# Patient Record
Sex: Male | Born: 1985 | Race: Black or African American | Hispanic: Yes | Marital: Single | Smoking: Never smoker
Health system: Southern US, Community
[De-identification: ages and names within clinical notes are randomized; demographics above are authoritative.]

---

## 2009-12-26 ENCOUNTER — Emergency Department (HOSPITAL_COMMUNITY): Admission: EM | Admit: 2009-12-26 | Discharge: 2009-12-27 | Payer: Self-pay | Admitting: Emergency Medicine

## 2010-11-23 LAB — URINALYSIS, ROUTINE W REFLEX MICROSCOPIC
Glucose, UA: NEGATIVE mg/dL
Ketones, ur: NEGATIVE mg/dL
Leukocytes, UA: NEGATIVE
Nitrite: NEGATIVE
Specific Gravity, Urine: 1.015 (ref 1.005–1.030)
pH: 6 (ref 5.0–8.0)

## 2010-11-23 LAB — URINE MICROSCOPIC-ADD ON

## 2010-11-23 LAB — URINE CULTURE

## 2011-08-30 IMAGING — CT CT ABD-PELV W/O CM
2 of 3 series · 9 of 46 positions shown, 11 images · non-contrast
Comparison: None

CLINICAL DATA: Right flank pain and hematuria; difficulty
urinating.

CT ABDOMEN AND PELVIS WITHOUT CONTRAST
TECHNIQUE: Multidetector CT imaging of the abdomen and pelvis was
performed following the standard protocol without intravenous
contrast.

[Series 4: mpr coronal (id) · coronal · 0.74mm/px · 8 of 99 slices shown, 9 images]
[im 11/99  soft-tissue]
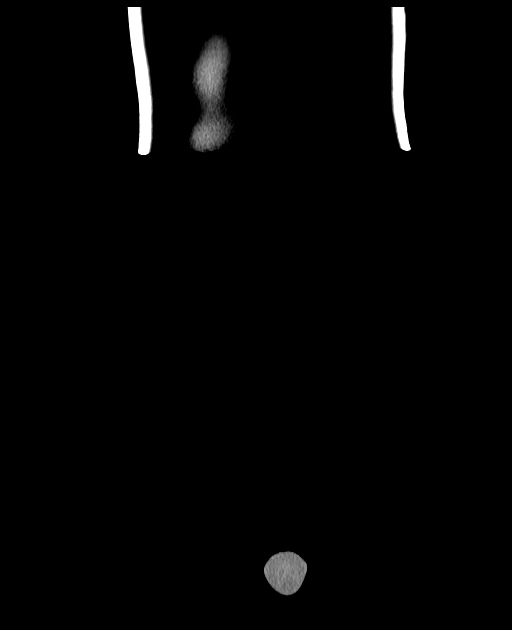
[im 11/99  bone]
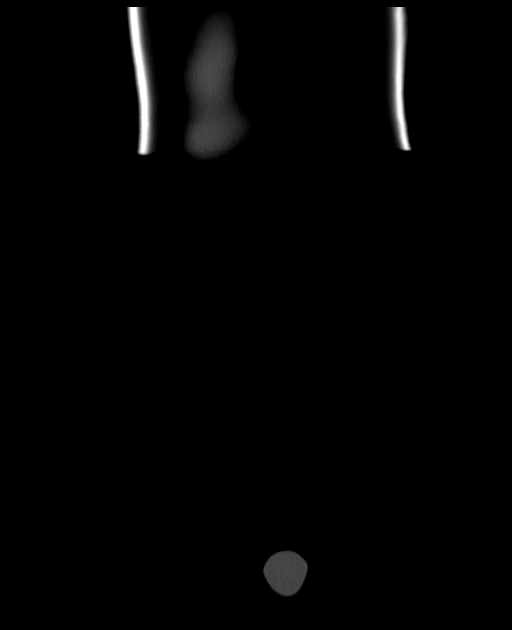
[im 22/99  soft-tissue]
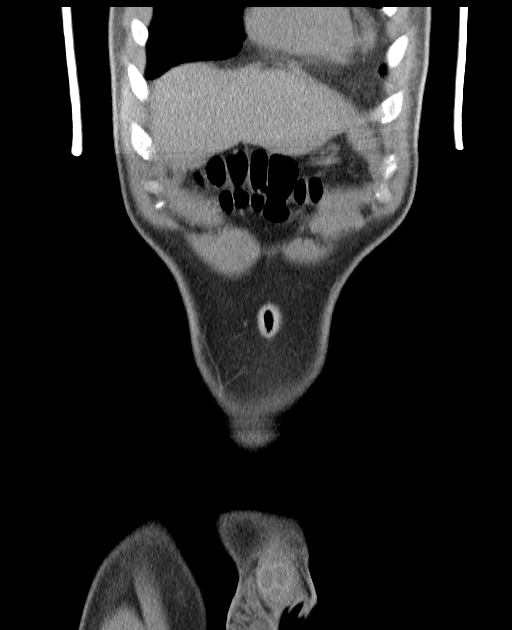
[im 33/99  soft-tissue]
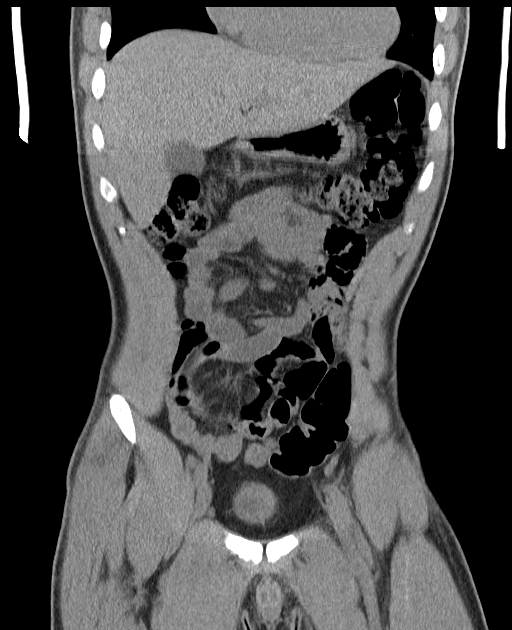
[im 44/99  soft-tissue]
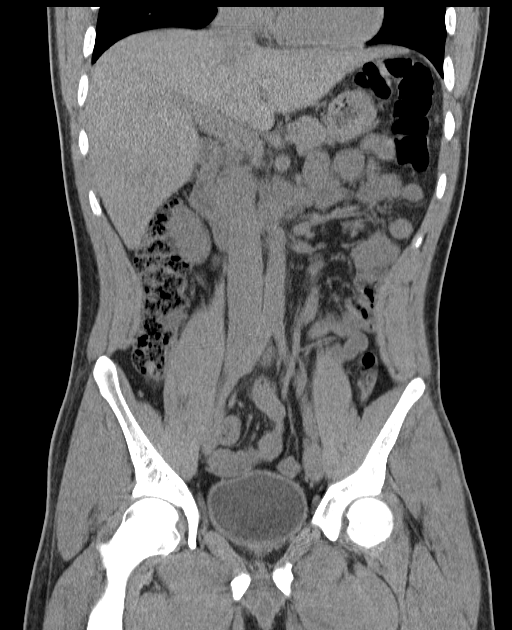
[im 55/99  soft-tissue]
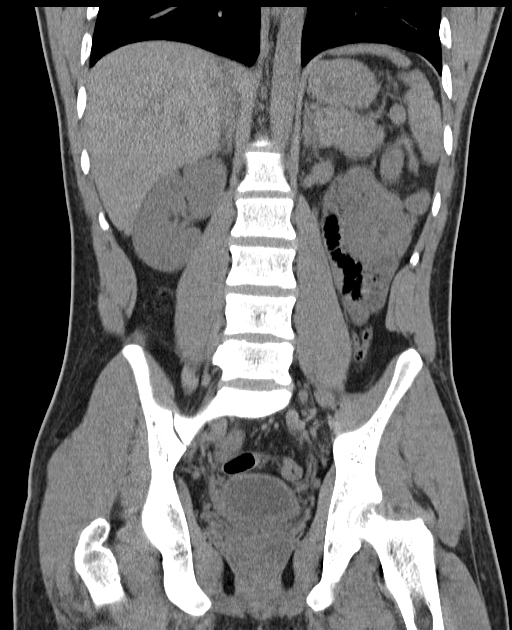
[im 66/99  soft-tissue]
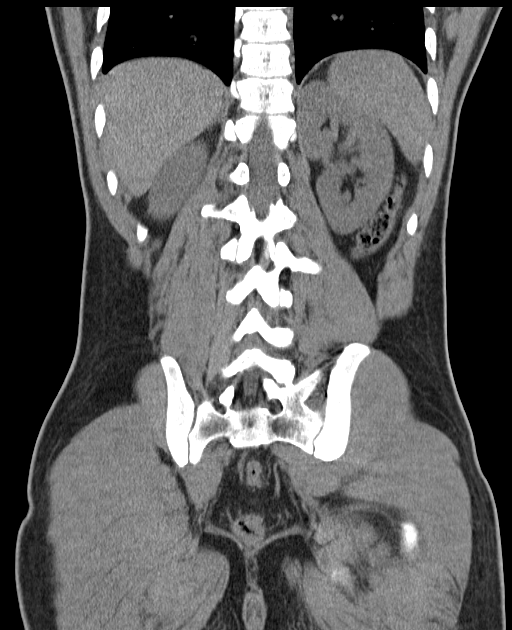
[im 77/99  soft-tissue]
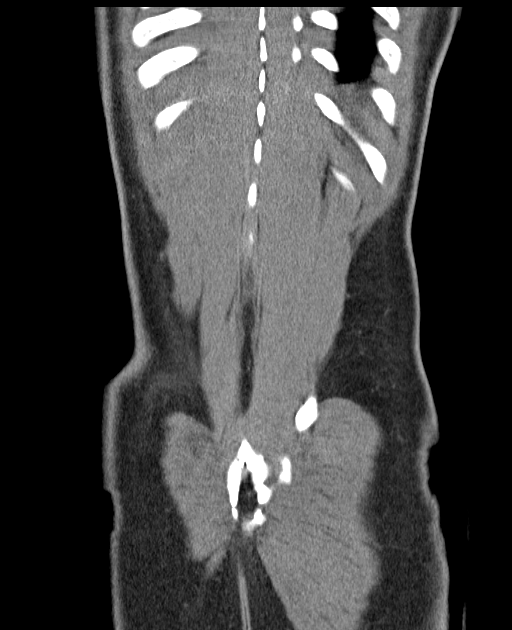
[im 88/99  soft-tissue]
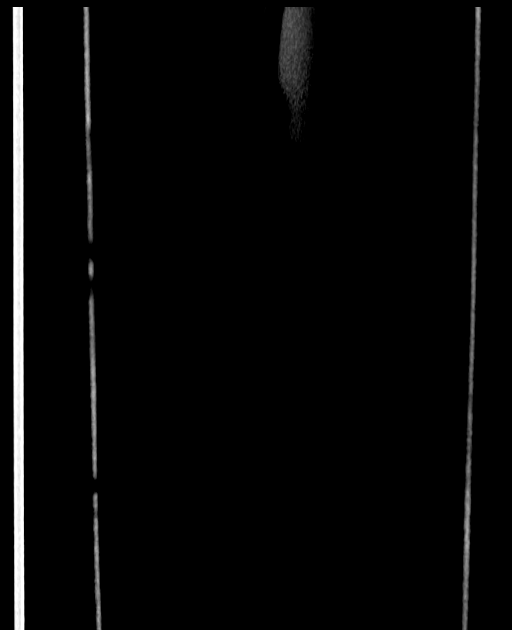

[Series 5: mpr sagittal (id) · sagittal · 0.56mm/px · 1 of 141 slices shown, 2 images]
[im 47/141  soft-tissue]
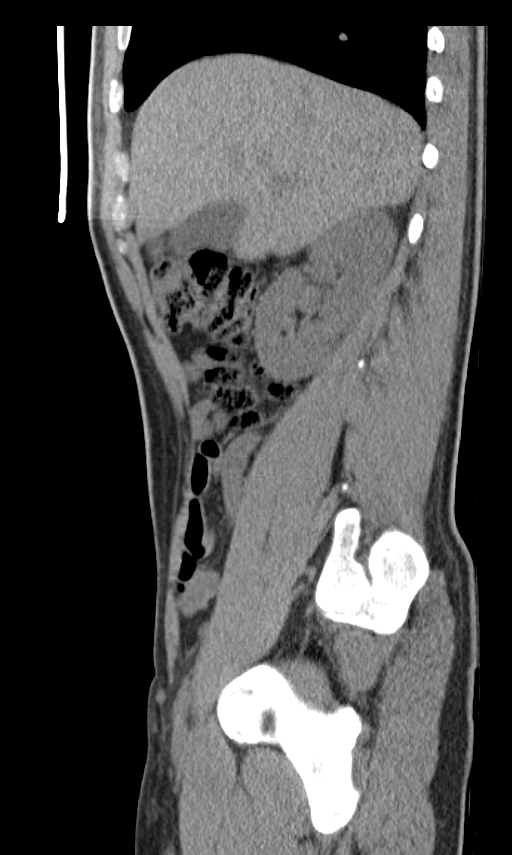
[im 47/141  bone]
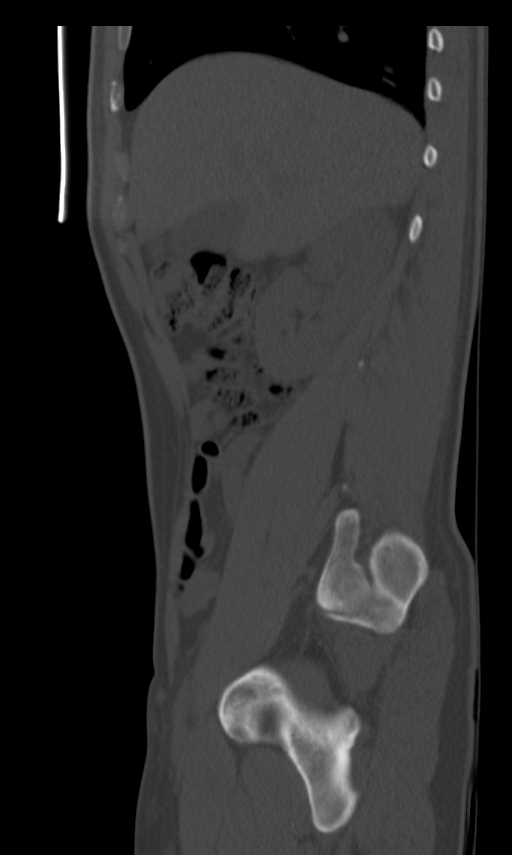

[9 of 46 positions shown; findings below may reference images not displayed]

FINDINGS: The visualized lung bases are clear.

The liver and spleen are unremarkable in appearance.  The
gallbladder is within normal limits.  The pancreas and adrenal
glands are unremarkable.

The kidneys are unremarkable in appearance.  There is no evidence
of hydronephrosis.  No ureteral dilatation is appreciated.  No
renal or ureteral stones are identified.  No perinephric stranding
is seen.

No free fluid is identified.  The small bowel is unremarkable in
appearance.  The stomach is within normal limits.  No acute
vascular abnormalities are seen.

The colon is unremarkable in appearance.  The appendix is normal in
caliber and contains air, without evidence for acute appendicitis.

The bladder is mildly smoothly thick-walled; this could reflect
mild acute cystitis.  No focal bladder mass is identified.  No
significant surrounding stranding is appreciated.  The prostate
remains normal in size.  No inguinal lymphadenopathy is identified.

No acute osseous abnormalities are identified.
IMPRESSION: 1.  Mildly smoothly thick-walled bladder, without a focal bladder
mass.  This could reflect mild acute cystitis.
2.  No evidence of hydronephrosis; kidneys unremarkable in
appearance.

## 2011-12-21 ENCOUNTER — Encounter (HOSPITAL_COMMUNITY): Payer: Self-pay | Admitting: *Deleted

## 2011-12-21 ENCOUNTER — Emergency Department (HOSPITAL_COMMUNITY)
Admission: EM | Admit: 2011-12-21 | Discharge: 2011-12-22 | Disposition: A | Discharge status: Home / Self Care | Payer: BC Managed Care – PPO | Attending: Emergency Medicine | Admitting: Emergency Medicine

## 2011-12-21 DIAGNOSIS — S91009A Unspecified open wound, unspecified ankle, initial encounter: Secondary | ICD-10-CM | POA: Insufficient documentation

## 2011-12-21 DIAGNOSIS — S81009A Unspecified open wound, unspecified knee, initial encounter: Secondary | ICD-10-CM | POA: Insufficient documentation

## 2011-12-21 DIAGNOSIS — W260XXA Contact with knife, initial encounter: Secondary | ICD-10-CM | POA: Insufficient documentation

## 2011-12-21 DIAGNOSIS — M79609 Pain in unspecified limb: Secondary | ICD-10-CM | POA: Insufficient documentation

## 2011-12-21 DIAGNOSIS — S81812A Laceration without foreign body, left lower leg, initial encounter: Secondary | ICD-10-CM

## 2011-12-21 NOTE — ED Notes (Signed)
Pt reports he was cooking and accidentally cut his left thigh with a knife, approx 1 inch cut noted, bleeding controlled

## 2011-12-21 NOTE — ED Notes (Signed)
Pt states that he was cooking and dropped knife and accidentally cut self to left lower leg

## 2011-12-22 MED ORDER — ONDANSETRON HCL 4 MG PO TABS
4.0000 mg | ORAL_TABLET | Freq: Once | ORAL | Status: AC
  Administered 2011-12-22: 4 mg via ORAL
  Filled 2011-12-22: qty 1

## 2011-12-22 MED ORDER — DIPHTH-ACELL PERTUSSIS-TETANUS 25-58-10 LF-MCG/0.5 IM SUSP
0.5000 mL | Freq: Once | INTRAMUSCULAR | Status: AC
  Administered 2011-12-22: 0.5 mL via INTRAMUSCULAR
  Filled 2011-12-22: qty 0.5

## 2011-12-22 MED ORDER — HYDROCODONE-ACETAMINOPHEN 5-325 MG PO TABS
2.0000 | ORAL_TABLET | Freq: Once | ORAL | Status: AC
  Administered 2011-12-22: 2 via ORAL
  Filled 2011-12-22: qty 2

## 2011-12-22 MED ORDER — HYDROCODONE-ACETAMINOPHEN 5-325 MG PO TABS
ORAL_TABLET | ORAL | Status: DC
Start: 2011-12-22 — End: 2016-04-15

## 2011-12-22 NOTE — ED Provider Notes (Signed)
History     CSN: [id number]  Arrival date & time 12/21/11  2137   First MD Initiated Contact with Patient 12/22/11 0004      Chief Complaint  Patient presents with  . Laceration    (Consider location/radiation/quality/duration/timing/severity/associated sxs/prior treatment) Patient is a 26 y.o. male presenting with skin laceration. The history is provided by the patient.  Laceration  The incident occurred 3 to 5 hours ago. The laceration is located on the left leg. The laceration is 2 cm in size. The laceration mechanism was a a clean knife. The pain is moderate. The pain has been constant since onset. He reports no foreign bodies present. His tetanus status is out of date.    History reviewed. No pertinent past medical history.  History reviewed. No pertinent past surgical history.  No family history on file.  History  Substance Use Topics  . Smoking status: Never Smoker   . Smokeless tobacco: Not on file  . Alcohol Use: No      Review of Systems  Constitutional: Negative for activity change.       All ROS Neg except as noted in HPI  HENT: Negative for nosebleeds and neck pain.   Eyes: Negative for photophobia and discharge.  Respiratory: Negative for cough, shortness of breath and wheezing.   Cardiovascular: Negative for chest pain and palpitations.  Gastrointestinal: Negative for abdominal pain and blood in stool.  Genitourinary: Negative for dysuria, frequency and hematuria.  Musculoskeletal: Negative for back pain and arthralgias.  Skin: Negative.   Neurological: Negative for dizziness, seizures and speech difficulty.  Psychiatric/Behavioral: Negative for hallucinations and confusion.    Allergies  Review of patient's allergies indicates no known allergies.  Home Medications   Current Outpatient Rx  Name Route Sig Dispense Refill  . HYDROCODONE-ACETAMINOPHEN 5-325 MG PO TABS  1 or 2 po q4h prn pain 15 tablet 0    BP 144/89  Pulse 64  Temp(Src) 97.4  F (36.3 C) (Oral)  Resp 20  Wt 163 lb 3.2 oz (74.027 kg)  SpO2 100%  Physical Exam  Nursing note and vitals reviewed. Constitutional: He is oriented to person, place, and time. He appears well-developed and well-nourished.  Non-toxic appearance.  HENT:  Head: Normocephalic.  Right Ear: Tympanic membrane and external ear normal.  Left Ear: Tympanic membrane and external ear normal.  Eyes: EOM and lids are normal. Pupils are equal, round, and reactive to light.  Neck: Normal range of motion. Neck supple. Carotid bruit is not present.  Cardiovascular: Normal rate, regular rhythm, normal heart sounds, intact distal pulses and normal pulses.   Pulmonary/Chest: Breath sounds normal. No respiratory distress.  Abdominal: Soft. Bowel sounds are normal. There is no tenderness. There is no guarding.  Musculoskeletal: Normal range of motion.       Laceration to the left lateral calf. Bleeding control. No bone or tendon involvement. Distal pulses, sensory wnl. Good cap refill.  Lymphadenopathy:       Head (right side): No submandibular adenopathy present.       Head (left side): No submandibular adenopathy present.    He has no cervical adenopathy.  Neurological: He is alert and oriented to person, place, and time. He has normal strength. No cranial nerve deficit or sensory deficit.  Skin: Skin is warm and dry.  Psychiatric: He has a normal mood and affect. His speech is normal.    ED Course  Procedures : LACERATION REPAIR LEFT LOWER LEG. -  The Left lateral calf  was painted with betadine and infiltrated with 0.5% Sensorcaine. The wound was cleansed wth a surgical brush and irrigated. Wound repaired with 8 staples. Sterile dressing applied by me. Pt tolerated procedure without problem. Tetanus updated.  Labs Reviewed - No data to display No results found.   1. Laceration of lower leg, left       MDM  I have reviewed nursing notes, vital signs, and all appropriate lab and imaging results  for this patient. Pt sustained a laceration of the left calf. Wound repaired without problem. Pt to have staples removed in 10 days. Rx for  Norco given to the patient.       Kathie Dike, [place] 12/22/11 [phone]

## 2011-12-22 NOTE — Discharge Instructions (Signed)
Please keep wound clean and dry. Change dressing daily starting Friday night. Please come to the Emergency Room for staples to be removed in 10 days. (27th) Use Norco for pain if needed. This may cause drowsiness, use with caution.

## 2011-12-22 NOTE — ED Provider Notes (Signed)
Medical screening examination/treatment/procedure(s) were performed by non-physician practitioner and as supervising physician I was immediately available for consultation/collaboration.   Vida Roller, MD 12/22/11 [phone]

## 2012-01-08 ENCOUNTER — Emergency Department (HOSPITAL_COMMUNITY)
Admission: EM | Admit: 2012-01-08 | Discharge: 2012-01-08 | Disposition: A | Discharge status: Home / Self Care | Payer: BC Managed Care – PPO | Attending: Emergency Medicine | Admitting: Emergency Medicine

## 2012-01-08 ENCOUNTER — Encounter (HOSPITAL_COMMUNITY): Payer: Self-pay

## 2012-01-08 DIAGNOSIS — Z4802 Encounter for removal of sutures: Secondary | ICD-10-CM

## 2012-01-08 NOTE — ED Notes (Signed)
Pt had laceration to left lower leg that required staples two weeks ago, pt denies any problems

## 2012-01-08 NOTE — ED Notes (Signed)
7 staples removed from left lower leg, pt tolerated well

## 2012-01-08 NOTE — ED Notes (Signed)
Here today for staple removal from left lower leg

## 2012-01-08 NOTE — ED Provider Notes (Signed)
History     CSN: [id number]  Arrival date & time 01/08/12  1257   First MD Initiated Contact with Patient 01/08/12 1319      Chief Complaint  Patient presents with  . Suture / Staple Removal    (Consider location/radiation/quality/duration/timing/severity/associated sxs/prior treatment) HPI Comments: States he has been working every day and could get back to the ED for staple removal.  Patient is a 26 y.o. male presenting with suture removal. The history is provided by the patient. No language interpreter was used.  Suture / Staple Removal  Treated in ED: ~ 20 days ago. Treatments since wound repair include regular soap and water washings. His temperature was unmeasured prior to arrival. There has been no drainage from the wound. There is no redness present. There is no swelling present. The pain has no pain.    History reviewed. No pertinent past medical history.  History reviewed. No pertinent past surgical history.  No family history on file.  History  Substance Use Topics  . Smoking status: Never Smoker   . Smokeless tobacco: Not on file  . Alcohol Use: No      Review of Systems  Constitutional: Negative for fever.  Skin: Positive for wound.  All other systems reviewed and are negative.    Allergies  Review of patient's allergies indicates no known allergies.  Home Medications   Current Outpatient Rx  Name Route Sig Dispense Refill  . HYDROCODONE-ACETAMINOPHEN 5-325 MG PO TABS  1 or 2 po q4h prn pain 15 tablet 0    BP 104/61  Pulse 60  Temp(Src) 97.4 F (36.3 C) (Oral)  Resp 20  Ht 5\' 8"  (1.727 m)  Wt 180 lb (81.647 kg)  BMI 27.37 kg/m2  SpO2 100%  Physical Exam  Nursing note and vitals reviewed. Constitutional: He is oriented to person, place, and time. He appears well-developed and well-nourished.  HENT:  Head: Normocephalic and atraumatic.  Eyes: EOM are normal.  Neck: Normal range of motion.  Cardiovascular: Normal rate, regular rhythm,  normal heart sounds and intact distal pulses.   Pulmonary/Chest: Effort normal and breath sounds normal. No respiratory distress.  Abdominal: Soft. He exhibits no distension. There is no tenderness.  Musculoskeletal: Normal range of motion. He exhibits no tenderness.       Legs:      Wound is well healed.  No signs of infection.  Neurological: He is alert and oriented to person, place, and time.  Skin: Skin is warm and dry.  Psychiatric: He has a normal mood and affect. Judgment normal.    ED Course  Procedures (including critical care time)  Labs Reviewed - No data to display No results found.   1. Encounter for staple removal       MDM  Return prn        Worthy Rancher, [place] 01/08/12 1347

## 2012-01-09 NOTE — ED Provider Notes (Signed)
Medical screening examination/treatment/procedure(s) were performed by non-physician practitioner and as supervising physician I was immediately available for consultation/collaboration.  Daliana Leverett, MD 01/09/12 2158 

## 2016-04-15 ENCOUNTER — Encounter (HOSPITAL_COMMUNITY): Payer: Self-pay | Admitting: Family Medicine

## 2016-04-15 ENCOUNTER — Ambulatory Visit (HOSPITAL_COMMUNITY)
Admission: EM | Admit: 2016-04-15 | Discharge: 2016-04-15 | Disposition: A | Discharge status: Home / Self Care | Payer: Self-pay | Attending: Family Medicine | Admitting: Family Medicine

## 2016-04-15 DIAGNOSIS — N4889 Other specified disorders of penis: Secondary | ICD-10-CM

## 2016-04-15 NOTE — ED Triage Notes (Signed)
Pt reports rash on penis onset yest... Denies pain, fevers, penile d/c.  States he is sexually active in a monogamous relationship... A&O x4... NAD

## 2016-04-15 NOTE — ED Provider Notes (Signed)
[facility]    CSN: [id number] Arrival date & time: 04/15/16  1536  First Provider Contact:  First MD Initiated Contact with Patient 04/15/16 1610   History   Chief Complaint Chief Complaint  Patient presents with  . Exposure to STD    HPI Phillip Greer is a 30 y.o. male accompanied by his male partner presenting for penile rash he noticed yesterday. The rash is small white spots just under the foreskin and is not irritating, itchy, painful, draining, or spreading. He has had no STI's in the past. No fever. Partner exhibits no symptoms.   HPI  History reviewed. No pertinent past medical history.  There are no active problems to display for this patient.   History reviewed. No pertinent surgical history.     Home Medications    Prior to Admission medications   Not on File    Family History History reviewed. No pertinent family history.  Social History Social History  Substance Use Topics  . Smoking status: Never Smoker  . Smokeless tobacco: Never Used  . Alcohol use No     Allergies   Review of patient's allergies indicates no known allergies.   Review of Systems Review of Systems As above  Physical Exam Triage Vital Signs ED Triage Vitals  Enc Vitals Group     BP 04/15/16 1618 117/77     Pulse Rate 04/15/16 1618 60     Resp 04/15/16 1618 20     Temp 04/15/16 1618 97.9 F (36.6 C)     Temp Source 04/15/16 1618 Oral     SpO2 04/15/16 1618 99 %     Weight --      Height --      Head Circumference --      Peak Flow --      Pain Score 04/15/16 1619 1     Pain Loc --      Pain Edu? --      Excl. in GC? --    No data found.   Updated Vital Signs BP 117/77 (BP Location: Left Arm)   Pulse 60   Temp 97.9 F (36.6 C) (Oral)   Resp 20   SpO2 99%   Visual Acuity Right Eye Distance:   Left Eye Distance:   Bilateral Distance:    Right Eye Near:   Left Eye Near:    Bilateral Near:     Physical Exam  Constitutional: He  appears well-developed and well-nourished.  Genitourinary:  Genitourinary Comments: Uncircumcised penis with several pinpoint pearly papules on and proximal to the dorsal corona. No vesicles or other lesions. Scrotum unaffected.   Vitals reviewed. No discharge.   UC Treatments / Results  Labs (all labs ordered are listed, but only abnormal results are displayed) Labs Reviewed - No data to display  EKG  EKG Interpretation None       Radiology No results found.  Procedures Procedures (including critical care time)  Medications Ordered in UC Medications - No data to display   Initial Impression / Assessment and Plan / UC Course  I have reviewed the triage vital signs and the nursing notes.  Pertinent labs & imaging results that were available during my care of the patient were reviewed by me and considered in my medical decision making (see chart for details).  Final Clinical Impressions(s) / UC Diagnoses   Final diagnoses:  Pearly penile papules   30 y.o. male with pearly penile papules. Reviewed benign nature of this  finding and allayed fears of STI. Both he and his partner have recently had negative STI work ups as well. He will return if symptoms appear.   New Prescriptions Current Discharge Medication List       Rupert Azzara B Lylia Karn,Tyrone Nine MD 04/15/16 [phone]

## 2016-04-15 NOTE — Discharge Instructions (Signed)
The rash is considered a normal variant. It is NOT A DISEASE and is NOT SEXUALLY TRANSMITTED.

## 2017-06-24 ENCOUNTER — Encounter (HOSPITAL_COMMUNITY): Payer: Self-pay | Admitting: Emergency Medicine

## 2017-06-24 ENCOUNTER — Ambulatory Visit (HOSPITAL_COMMUNITY)
Admission: EM | Admit: 2017-06-24 | Discharge: 2017-06-24 | Disposition: A | Discharge status: Home / Self Care | Payer: Self-pay | Attending: Family Medicine | Admitting: Family Medicine

## 2017-06-24 DIAGNOSIS — N485 Ulcer of penis: Secondary | ICD-10-CM | POA: Insufficient documentation

## 2017-06-24 MED ORDER — MUPIROCIN CALCIUM 2 % EX CREA: 1.0000 "application " | TOPICAL_CREAM | Freq: Two times a day (BID) | CUTANEOUS | 0 refills | Status: AC

## 2017-06-24 NOTE — ED Triage Notes (Signed)
Pt reports mult blisters on shaft of penis ... Pt is not circumcised  Denies pain, fevers, chills  Sexually active in a monogamous relationship x3 years... Does not use condoms  A&O X4... NAD... Ambulatory

## 2017-06-24 NOTE — ED Notes (Signed)
Sent to the bathroom for a dirty urine 

## 2017-06-25 LAB — RPR: RPR: NONREACTIVE

## 2017-06-25 LAB — HIV ANTIBODY (ROUTINE TESTING W REFLEX): HIV SCREEN 4TH GENERATION: NONREACTIVE

## 2017-06-26 LAB — URINE CYTOLOGY ANCILLARY ONLY
Chlamydia: NEGATIVE
Neisseria Gonorrhea: NEGATIVE
Trichomonas: NEGATIVE

## 2017-06-26 NOTE — ED Provider Notes (Signed)
  [facility]   [id number] 06/24/17 Arrival Time: 1625  ASSESSMENT & PLAN:  1. Penile ulcer     Meds ordered this encounter  Medications  . mupirocin cream (BACTROBAN) 2 %    Sig: Apply 1 application topically 2 (two) times daily.    Dispense:  15 g    Refill:  0   Urine cytology, RPR, HIV sent at patient's request. The lesion on his penis does not look like HSV to me. Will monitor. Reviewed expectations re: course of current medical issues. Questions answered. Outlined signs and symptoms indicating need for more acute intervention. Patient verbalized understanding. After Visit Summary given.   SUBJECTIVE:  Phillip Greer is a 31 y.o. male who presents with complaint of "blister" on penis. Noticed a few days ago. Non-painful. No penile D/C. Worried about possible HSV. One male sexual partner for the past 3 years; with him today. No self treatment. No h/o STD or penile ulcer.  ROS: As per HPI.   OBJECTIVE:  Vitals:   06/24/17 1643  BP: 113/67  Pulse: 72  Resp: 16  Temp: 98.3 F (36.8 C)  TempSrc: Oral  SpO2: 100%    General appearance: alert; no distress Abdomen: soft, non-tender; bowel sounds normal; no masses or organomegaly; no guarding or rebound tenderness Back: no CVA tenderness GU: at head of penis there is a small bump/erosion; appear he has been scratching it with resulting small excoriation; no sign of infection; non-tender to palpation Extremities: no cyanosis or edema; symmetrical with no gross deformities Skin: warm and dry Psychological: alert and cooperative; normal mood and affect  Labs: Results for orders placed or performed during the hospital encounter of 06/24/17  RPR  Result Value Ref Range   RPR Ser Ql Non Reactive Non Reactive  HIV antibody  Result Value Ref Range   HIV Screen 4th Generation wRfx Non Reactive Non Reactive   Labs Reviewed  RPR  HIV ANTIBODY (ROUTINE TESTING)  URINE CYTOLOGY ANCILLARY ONLY     Imaging: No results found.  No Known Allergies   Social History   Social History  . Marital status: Single    Spouse name: N/A  . Number of children: N/A  . Years of education: N/A   Occupational History  . Not on file.   Social History Main Topics  . Smoking status: Never Smoker  . Smokeless tobacco: Never Used  . Alcohol use No  . Drug use: No  . Sexual activity: Yes   Other Topics Concern  . Not on file   Social History Narrative  . No narrative on file      Mardella LaymanHagler, Jasmen Emrich, MD 06/26/17 1004

## 2017-06-28 LAB — URINE CYTOLOGY ANCILLARY ONLY: Candida vaginitis: NEGATIVE

## 2019-07-31 ENCOUNTER — Emergency Department (HOSPITAL_COMMUNITY)
Admission: EM | Admit: 2019-07-31 | Discharge: 2019-08-01 | Disposition: A | Discharge status: Home / Self Care | Payer: Self-pay | Attending: Emergency Medicine | Admitting: Emergency Medicine

## 2019-07-31 ENCOUNTER — Other Ambulatory Visit: Payer: Self-pay

## 2019-07-31 ENCOUNTER — Encounter (HOSPITAL_COMMUNITY): Payer: Self-pay | Admitting: Emergency Medicine

## 2019-07-31 ENCOUNTER — Emergency Department (HOSPITAL_COMMUNITY): Payer: Self-pay

## 2019-07-31 DIAGNOSIS — S61209A Unspecified open wound of unspecified finger without damage to nail, initial encounter: Secondary | ICD-10-CM

## 2019-07-31 DIAGNOSIS — Y929 Unspecified place or not applicable: Secondary | ICD-10-CM | POA: Insufficient documentation

## 2019-07-31 DIAGNOSIS — W312XXA Contact with powered woodworking and forming machines, initial encounter: Secondary | ICD-10-CM | POA: Insufficient documentation

## 2019-07-31 DIAGNOSIS — S61200A Unspecified open wound of right index finger without damage to nail, initial encounter: Secondary | ICD-10-CM | POA: Insufficient documentation

## 2019-07-31 DIAGNOSIS — S62632B Displaced fracture of distal phalanx of right middle finger, initial encounter for open fracture: Secondary | ICD-10-CM | POA: Insufficient documentation

## 2019-07-31 DIAGNOSIS — S62639B Displaced fracture of distal phalanx of unspecified finger, initial encounter for open fracture: Secondary | ICD-10-CM

## 2019-07-31 DIAGNOSIS — Y999 Unspecified external cause status: Secondary | ICD-10-CM | POA: Insufficient documentation

## 2019-07-31 DIAGNOSIS — Y9389 Activity, other specified: Secondary | ICD-10-CM | POA: Insufficient documentation

## 2019-07-31 DIAGNOSIS — Z23 Encounter for immunization: Secondary | ICD-10-CM | POA: Insufficient documentation

## 2019-07-31 DIAGNOSIS — S6990XA Unspecified injury of unspecified wrist, hand and finger(s), initial encounter: Secondary | ICD-10-CM

## 2019-07-31 NOTE — ED Triage Notes (Signed)
Patient reports injury to right distal middle and index finger this afternoon from a wood router machine at work , dressings applied prior to arrival with minimal bleeding .

## 2019-08-01 MED ORDER — TETANUS-DIPHTH-ACELL PERTUSSIS 5-2.5-18.5 LF-MCG/0.5 IM SUSP
0.5000 mL | Freq: Once | INTRAMUSCULAR | Status: AC
  Administered 2019-08-01: 06:00:00 0.5 mL via INTRAMUSCULAR
  Filled 2019-08-01: qty 0.5

## 2019-08-01 MED ORDER — FENTANYL CITRATE (PF) 100 MCG/2ML IJ SOLN
50.0000 ug | Freq: Once | INTRAMUSCULAR | Status: AC
  Administered 2019-08-01: 06:00:00 50 ug via INTRAVENOUS
  Filled 2019-08-01: qty 2

## 2019-08-01 MED ORDER — HYDROCODONE-ACETAMINOPHEN 5-325 MG PO TABS: 1.0000 | ORAL_TABLET | ORAL | 0 refills | Status: AC | PRN

## 2019-08-01 MED ORDER — CEFAZOLIN SODIUM-DEXTROSE 1-4 GM/50ML-% IV SOLN
1.0000 g | Freq: Once | INTRAVENOUS | Status: AC
  Administered 2019-08-01: 06:00:00 1 g via INTRAVENOUS
  Filled 2019-08-01: qty 50

## 2019-08-01 MED ORDER — CEPHALEXIN 500 MG PO CAPS: 500.0000 mg | ORAL_CAPSULE | Freq: Four times a day (QID) | ORAL | 0 refills | Status: AC

## 2019-08-01 MED ORDER — SODIUM CHLORIDE 0.9 % IV BOLUS
1000.0000 mL | Freq: Once | INTRAVENOUS | Status: AC
  Administered 2019-08-01: 06:00:00 1000 mL via INTRAVENOUS

## 2019-08-01 MED ORDER — CEPHALEXIN 500 MG PO CAPS: 500.0000 mg | ORAL_CAPSULE | Freq: Four times a day (QID) | ORAL | 0 refills | Status: DC

## 2019-08-01 NOTE — Progress Notes (Signed)
Orthopedic Tech Progress Note Patient Details:  Phillip Greer 1986/04/10 [id number]  Ortho Devices Type of Ortho Device: Finger splint Ortho Device/Splint Location: LUE Ortho Device/Splint Interventions: Application, Ordered   Post Interventions Patient Tolerated: Well Instructions Provided: Adjustment of device, Care of device   Janit Pagan 08/01/2019, 9:52 AM

## 2019-08-01 NOTE — ED Notes (Signed)
ED Provider at bedside. 

## 2019-08-01 NOTE — Discharge Instructions (Addendum)
Follow up with Dr. Lenon Curt, call tomorrow or Monday to schedule an appointment. There is a broken bone in your finger, this can get infected if not cared for properly. Take Keflex as prescribed and complete the full course. Take Norco as needed as prescribed for pain, do not drive while taking Norco.

## 2019-08-01 NOTE — ED Provider Notes (Signed)
MSE was initiated and I personally evaluated the patient and placed orders (if any) at  6:12 AM on August 01, 2019.  The patient appears stable so that the remainder of the MSE may be completed by another provider.   Fatima Blank, MD 08/01/19 [phone]

## 2019-08-01 NOTE — ED Provider Notes (Signed)
[facility] EMERGENCY DEPARTMENT Provider Note   CSN: [id number] Arrival date & time: 07/31/19  2245     History   Chief Complaint Chief Complaint  Patient presents with  . Finger injury    HPI Phillip Greer is a 33 y.o. male.     33yo right hand dominant male with injury to the right 2nd and 3rd fingers while using a wood router tool a few hours prior to arriving in the ER today. Patient washed his hand, bandaged it and went home before deciding to come in. Patient was given Td booster and Ancef prior to my evaluation tonight. No other injuries or concerns.      History reviewed. No pertinent past medical history.  There are no active problems to display for this patient.   History reviewed. No pertinent surgical history.      Home Medications    Prior to Admission medications   Medication Sig Start Date End Date Taking? Authorizing Provider  cephALEXin (KEFLEX) 500 MG capsule Take 1 capsule (500 mg total) by mouth 4 (four) times daily for 10 days. 08/01/19 08/11/19  Jeannie Fend, PA-C  HYDROcodone-acetaminophen (NORCO/VICODIN) 5-325 MG tablet Take 1 tablet by mouth every 4 (four) hours as needed. 08/01/19   Jeannie Fend, PA-C  mupirocin cream (BACTROBAN) 2 % Apply 1 application topically 2 (two) times daily. 06/24/17   Mardella Layman, MD    Family History No family history on file.  Social History Social History   Tobacco Use  . Smoking status: Never Smoker  . Smokeless tobacco: Never Used  Substance Use Topics  . Alcohol use: No  . Drug use: No     Allergies   Patient has no known allergies.   Review of Systems Review of Systems  Constitutional: Negative for fever.  Musculoskeletal: Positive for myalgias.  Skin: Positive for wound.  Allergic/Immunologic: Negative for immunocompromised state.  Neurological: Negative for weakness and numbness.  Hematological: Does not bruise/bleed easily.  All other systems reviewed  and are negative.    Physical Exam Updated Vital Signs BP 117/89   Pulse 65   Temp 98.3 F (36.8 C) (Oral)   Resp 18   SpO2 99%   Physical Exam Vitals signs and nursing note reviewed.  Constitutional:      General: He is not in acute distress.    Appearance: He is well-developed. He is not diaphoretic.  HENT:     Head: Normocephalic and atraumatic.  Cardiovascular:     Pulses: Normal pulses.  Pulmonary:     Effort: Pulmonary effort is normal.  Musculoskeletal:        General: Swelling, tenderness and signs of injury present.       Hands:  Skin:    General: Skin is warm and dry.     Findings: No erythema or rash.  Neurological:     Mental Status: He is alert and oriented to person, place, and time.     Sensory: No sensory deficit.  Psychiatric:        Behavior: Behavior normal.    Media Information    Document Information  Photos  R hand II   08/01/2019 06:01  Attached To:  Hospital Encounter on 07/31/19  Source Information  Cardama, Amadeo Garnet, MD  [facility]   Media Information    Document Information  Photos  R hand   08/01/2019 06:00  Attached To:  Hospital Encounter on 07/31/19  Source Information  Cardama, Amadeo Garnet,  MD  [facility]     ED Treatments / Results  Labs (all labs ordered are listed, but only abnormal results are displayed) Labs Reviewed - No data to display  EKG None  Radiology Dg Hand Complete Right  Result Date: 08/01/2019 CLINICAL DATA:  Pain EXAM: RIGHT HAND - COMPLETE 3+ VIEW COMPARISON:  None FINDINGS: There is soft tissue swelling about the second third digits. There is a soft tissue defect involving the third digit. Findings are suspicious for a fracture through the tuft of the distal phalanx of the third digit. The distal fracture fragment is not clearly identified and may be absent. IMPRESSION: 1. Findings suspicious for a fracture through the tuft of the distal phalanx of the third  digit. The distal fracture fragment is not clearly identified and may be absent. 2. Soft tissue swelling about the second third digits. Electronically Signed   By: Constance Holster M.D.   On: 08/01/2019 00:01    Procedures Procedures (including critical care time)  Medications Ordered in ED Medications  ceFAZolin (ANCEF) IVPB 1 g/50 mL premix (0 g Intravenous Stopped 08/01/19 0659)  Tdap (BOOSTRIX) injection 0.5 mL (0.5 mLs Intramuscular Given 08/01/19 0618)  fentaNYL (SUBLIMAZE) injection 50 mcg (50 mcg Intravenous Given 08/01/19 0623)  sodium chloride 0.9 % bolus 1,000 mL (0 mLs Intravenous Stopped 08/01/19 0751)     Initial Impression / Assessment and Plan / ED Course  I have reviewed the triage vital signs and the nursing notes.  Pertinent labs & imaging results that were available during my care of the patient were reviewed by me and considered in my medical decision making (see chart for details).  Clinical Course as of Jul 31 1052  Thu Jul 31, 3069  2757 33 year old male with injury to right second and third fingers.  X-ray shows tuft fracture right third finger where patient is amputated the tip of his finger.  Patient was given Ancef and tetanus update, discussed with Dr. Lenon Curt, on-call with hand who recommends nonstick dressing, antibiotics and follow-up in the office next week.   [LM]    Clinical Course User Index [LM] Tacy Learn, PA-C      Final Clinical Impressions(s) / ED Diagnoses   Final diagnoses:  Open fracture of tuft of distal phalanx of finger  Open wound of finger, initial encounter    ED Discharge Orders         Ordered    cephALEXin (KEFLEX) 500 MG capsule  4 times daily,   Status:  Discontinued     08/01/19 0845    HYDROcodone-acetaminophen (NORCO/VICODIN) 5-325 MG tablet  Every 4 hours PRN     08/01/19 0845    cephALEXin (KEFLEX) 500 MG capsule  4 times daily     08/01/19 0922           Tacy Learn, PA-C 08/01/19 1053    Pattricia Boss, MD 08/01/19 1555

## 2021-04-02 IMAGING — DX DG HAND COMPLETE 3+V*R*
3 series · 3 of 3 positions shown · non-contrast
Comparison: None

CLINICAL DATA: Pain

EXAM:
RIGHT HAND - COMPLETE 3+ VIEW

[hand pa]
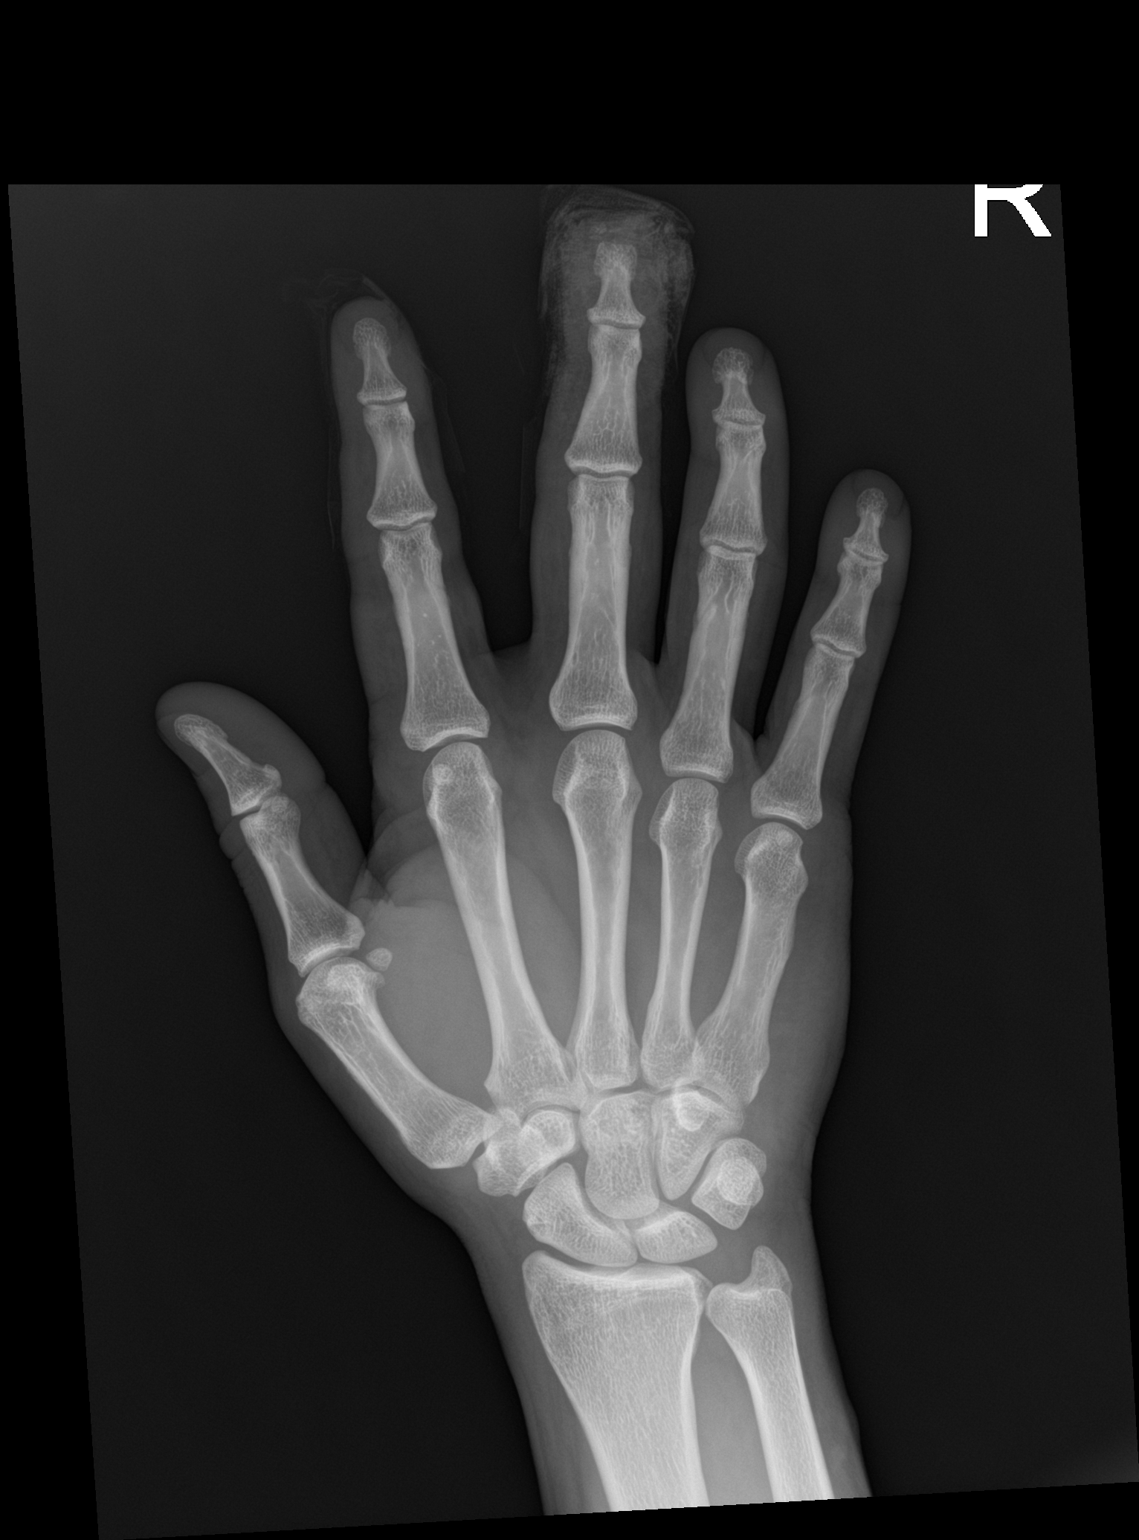

[hand obl]
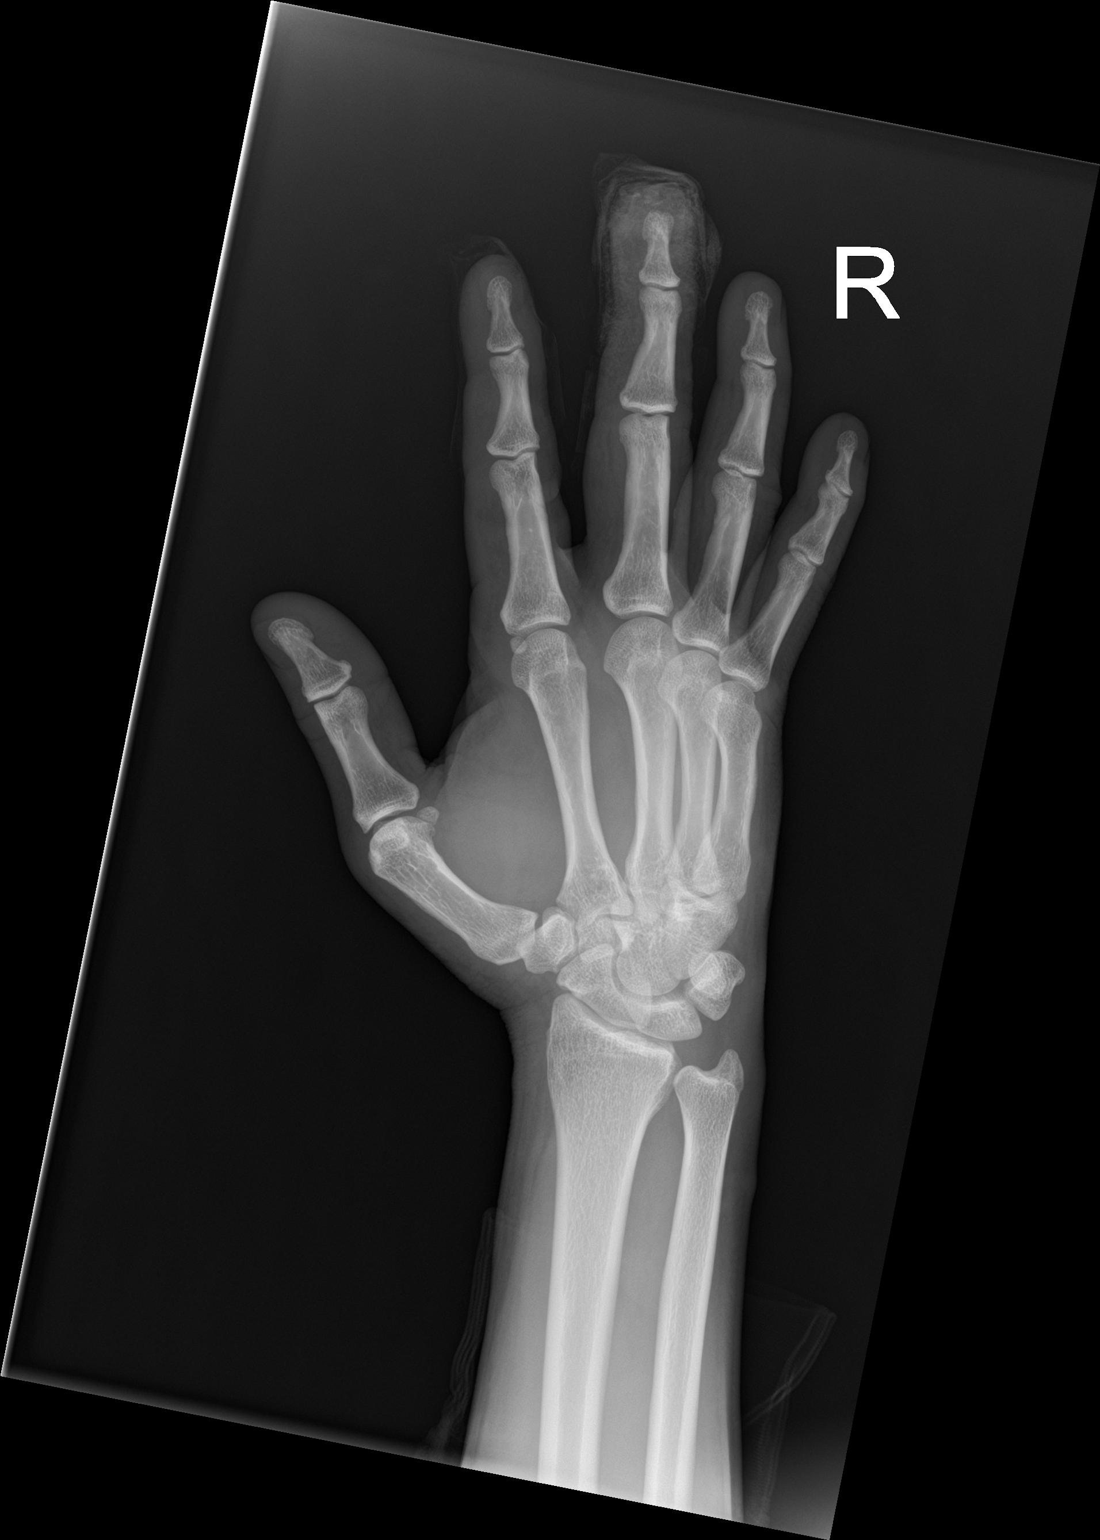

[hand lat]
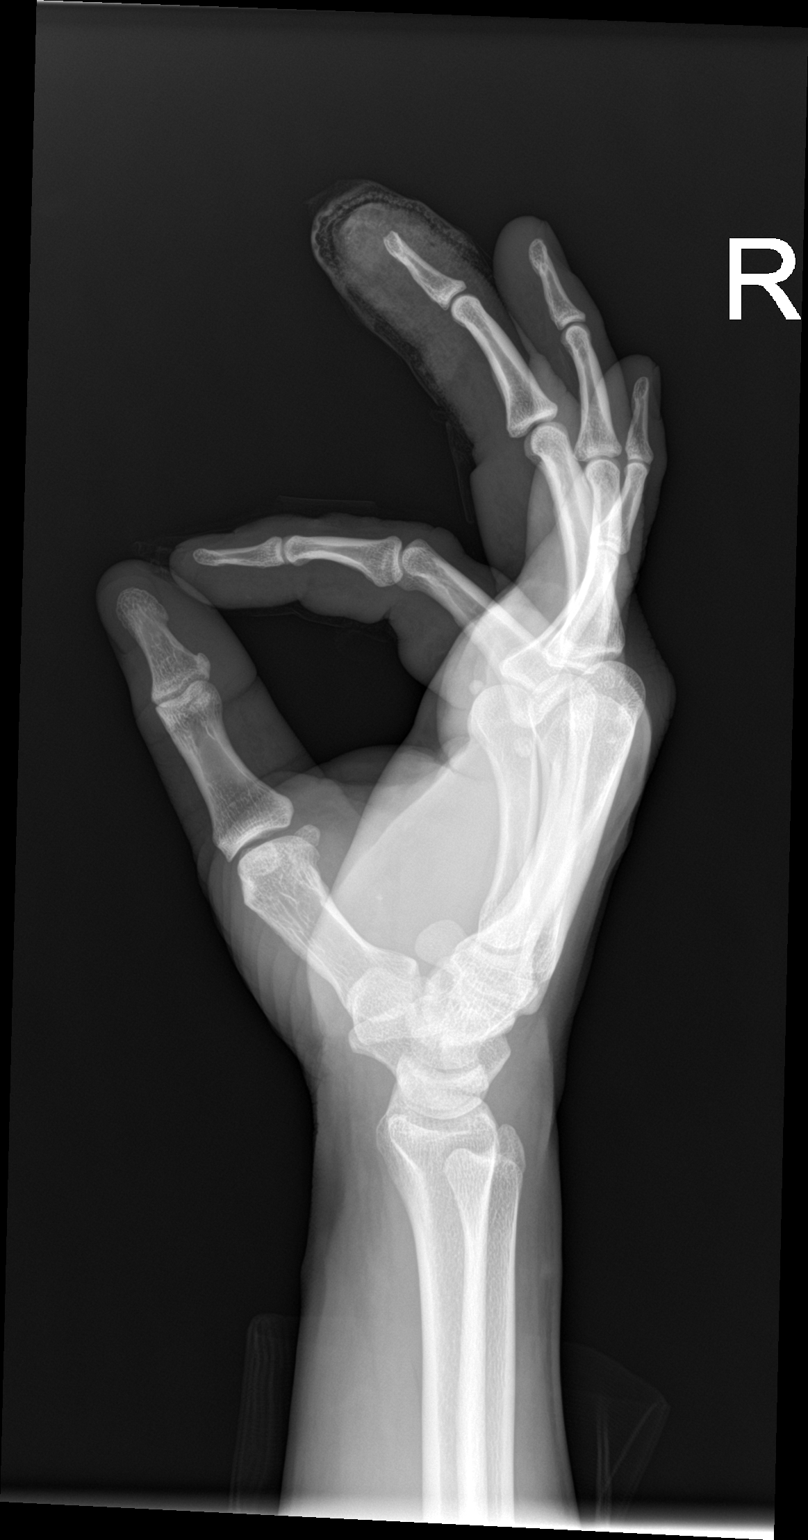

[3 of 3 positions shown; findings below may reference images not displayed]

FINDINGS: There is soft tissue swelling about the second third digits. There
is a soft tissue defect involving the third digit. Findings are
suspicious for a fracture through the tuft of the distal phalanx of
the third digit. The distal fracture fragment is not clearly
identified and may be absent.
IMPRESSION: 1. Findings suspicious for a fracture through the tuft of the distal
phalanx of the third digit. The distal fracture fragment is not
clearly identified and may be absent.
2. Soft tissue swelling about the second third digits.
